# Patient Record
Sex: Male | Born: 1985 | Race: White | Hispanic: No | Marital: Married | Smoking: Never smoker
Health system: Southern US, Community
[De-identification: ages and names within clinical notes are randomized; demographics above are authoritative.]

## PROBLEM LIST (undated history)

## (undated) DIAGNOSIS — F988 Other specified behavioral and emotional disorders with onset usually occurring in childhood and adolescence: Secondary | ICD-10-CM

## (undated) DIAGNOSIS — T7840XA Allergy, unspecified, initial encounter: Secondary | ICD-10-CM

## (undated) HISTORY — DX: Other specified behavioral and emotional disorders with onset usually occurring in childhood and adolescence: F98.8

## (undated) HISTORY — PX: FRACTURE SURGERY: SHX138

## (undated) HISTORY — DX: Allergy, unspecified, initial encounter: T78.40XA

---

## 2019-04-12 ENCOUNTER — Other Ambulatory Visit: Payer: Self-pay

## 2019-04-12 ENCOUNTER — Telehealth: Payer: Self-pay

## 2019-04-12 NOTE — Telephone Encounter (Signed)
Error

## 2019-04-13 ENCOUNTER — Other Ambulatory Visit: Payer: Self-pay

## 2019-04-13 ENCOUNTER — Ambulatory Visit (INDEPENDENT_AMBULATORY_CARE_PROVIDER_SITE_OTHER): Payer: BC Managed Care – PPO | Admitting: Nurse Practitioner

## 2019-04-13 ENCOUNTER — Encounter: Payer: Self-pay | Admitting: Nurse Practitioner

## 2019-04-13 VITALS — BP 126/72 | HR 76 | Temp 98.3°F | Resp 18 | Ht 73.0 in | Wt 259.6 lb

## 2019-04-13 DIAGNOSIS — J329 Chronic sinusitis, unspecified: Secondary | ICD-10-CM

## 2019-04-13 DIAGNOSIS — S80862A Insect bite (nonvenomous), left lower leg, initial encounter: Secondary | ICD-10-CM | POA: Diagnosis not present

## 2019-04-13 DIAGNOSIS — T7840XA Allergy, unspecified, initial encounter: Secondary | ICD-10-CM | POA: Insufficient documentation

## 2019-04-13 DIAGNOSIS — Z23 Encounter for immunization: Secondary | ICD-10-CM | POA: Diagnosis not present

## 2019-04-13 DIAGNOSIS — J45909 Unspecified asthma, uncomplicated: Secondary | ICD-10-CM | POA: Diagnosis not present

## 2019-04-13 DIAGNOSIS — L239 Allergic contact dermatitis, unspecified cause: Secondary | ICD-10-CM

## 2019-04-13 DIAGNOSIS — F988 Other specified behavioral and emotional disorders with onset usually occurring in childhood and adolescence: Secondary | ICD-10-CM | POA: Diagnosis not present

## 2019-04-13 DIAGNOSIS — Z1329 Encounter for screening for other suspected endocrine disorder: Secondary | ICD-10-CM

## 2019-04-13 DIAGNOSIS — W57XXXA Bitten or stung by nonvenomous insect and other nonvenomous arthropods, initial encounter: Secondary | ICD-10-CM

## 2019-04-13 DIAGNOSIS — Z7689 Persons encountering health services in other specified circumstances: Secondary | ICD-10-CM

## 2019-04-13 DIAGNOSIS — Z130 Encounter for screening for diseases of the blood and blood-forming organs and certain disorders involving the immune mechanism: Secondary | ICD-10-CM

## 2019-04-13 DIAGNOSIS — L03116 Cellulitis of left lower limb: Secondary | ICD-10-CM

## 2019-04-13 DIAGNOSIS — Z13220 Encounter for screening for lipoid disorders: Secondary | ICD-10-CM

## 2019-04-13 DIAGNOSIS — Z13228 Encounter for screening for other metabolic disorders: Secondary | ICD-10-CM

## 2019-04-13 HISTORY — DX: Unspecified asthma, uncomplicated: J45.909

## 2019-04-13 MED ORDER — DOXYCYCLINE HYCLATE 100 MG PO TABS: 100.0000 mg | ORAL_TABLET | Freq: Two times a day (BID) | ORAL | 0 refills | Status: DC

## 2019-04-13 NOTE — Progress Notes (Signed)
New Patient Office Visit  Subjective:  Patient ID: Alan Wong, male    DOB: [birth year]  Age: 34 y.o. MRN: [id number]  CC:  Chief Complaint  Patient presents with  . Establish Care    HPI Alan Wong is a 34 y.o. caucasian male presenting to clinic to establish care. Time spent discussing past medical hx of allergies with allergist in [place] did not feel helped does not desire referral in region, Asthma controlled with current regimen, stopped taking Singulair did not feel worked,  Desires ENT referral for opinion on chronic nasal and sinus pressure without exacerbations, has a fill for ADD medications not needing today will plan on monthly follow up for ADD, upt on vaccine except influenza and desires today, reports insect bite to left lower calf medial side initially was small red raised dot that started on Tuesday then Wed he noticed red rash that is pruritic spread. He has used zyrtec by mouth and steroid cream at time or two that did decrease minimally. Denied CP/CT, GU/GI sxs, pain, sob, palpitations, edema, or falls.  Past Medical History:  Diagnosis Date  . ADD (attention deficit disorder)   . Allergy   . Asthma   . Asthma 04/13/2019    History reviewed. No pertinent surgical history.  History reviewed. No pertinent family history.  Social History   Socioeconomic History  . Marital status: Married    Spouse name: Not on file  . Number of children: Not on file  . Years of education: Not on file  . Highest education level: Not on file  Occupational History  . Not on file  Tobacco Use  . Smoking status: Never Smoker  . Smokeless tobacco: Never Used  Substance and Sexual Activity  . Alcohol use: Yes    Comment: 3-4 per week  . Drug use: Never  . Sexual activity: Not on file  Other Topics Concern  . Not on file  Social History Narrative  . Not on file   Social Determinants of Health   Financial Resource Strain:   . Difficulty of Paying Living Expenses:  Not on file  Food Insecurity:   . Worried About Charity fundraiser in the Last Year: Not on file  . Ran Out of Food in the Last Year: Not on file  Transportation Needs:   . Lack of Transportation (Medical): Not on file  . Lack of Transportation (Non-Medical): Not on file  Physical Activity:   . Days of Exercise per Week: Not on file  . Minutes of Exercise per Session: Not on file  Stress:   . Feeling of Stress : Not on file  Social Connections:   . Frequency of Communication with Friends and Family: Not on file  . Frequency of Social Gatherings with Friends and Family: Not on file  . Attends Religious Services: Not on file  . Active Member of Clubs or Organizations: Not on file  . Attends Archivist Meetings: Not on file  . Marital Status: Not on file  Intimate Partner Violence:   . Fear of Current or Ex-Partner: Not on file  . Emotionally Abused: Not on file  . Physically Abused: Not on file  . Sexually Abused: Not on file    ROS Review of Systems  All other systems reviewed and are negative.   Objective:   Today's Vitals: BP 126/72 (BP Location: Right Arm, Patient Position: Sitting, Cuff Size: Large)   Pulse 76   Temp 98.3 F (36.8 C) (Oral)  Resp 18   Ht 6\' 1"  (1.854 m)   Wt 259 lb 9.6 oz (117.8 kg)   SpO2 96%   BMI 34.25 kg/m   Physical Exam Vitals and nursing note reviewed.  Constitutional:      Appearance: Normal appearance.  HENT:     Head: Normocephalic.     Right Ear: Tympanic membrane, ear canal and external ear normal.     Left Ear: Tympanic membrane, ear canal and external ear normal.     Nose: Nose normal.     Mouth/Throat:     Mouth: Mucous membranes are moist.     Pharynx: Oropharynx is clear.  Eyes:     Extraocular Movements: Extraocular movements intact.     Conjunctiva/sclera: Conjunctivae normal.     Pupils: Pupils are equal, round, and reactive to light.  Cardiovascular:     Rate and Rhythm: Normal rate and regular rhythm.       Pulses: Normal pulses.     Heart sounds: Normal heart sounds.  Pulmonary:     Effort: Pulmonary effort is normal.     Breath sounds: Normal breath sounds.  Abdominal:     General: Abdomen is flat. Bowel sounds are normal.     Palpations: Abdomen is soft.  Musculoskeletal:        General: Normal range of motion.     Cervical back: Normal range of motion and neck supple.  Lymphadenopathy:     Head:     Right side of head: No submental, submandibular, tonsillar, preauricular, posterior auricular or occipital adenopathy.     Left side of head: No submental, submandibular, tonsillar, preauricular, posterior auricular or occipital adenopathy.     Cervical: No cervical adenopathy.  Skin:    General: Skin is warm and dry.     Findings: Pustular rash: rash RLE.           Comments: See skin RLE exam  Neurological:     General: No focal deficit present.     Mental Status: He is alert and oriented to person, place, and time.  Psychiatric:        Mood and Affect: Mood normal.        Behavior: Behavior normal.        Thought Content: Thought content normal.        Judgment: Judgment normal.     Assessment & Plan:  You are not due for refill for ADD controlling medications, Follow up monthly for ADD Follow up end of 01/2020 or first of 01/2020 for annual exam with labs one week prior to appointment. As discussed, ENT referral for chronic sinus pressure will be completed today.   Start and complete antibiotic prescribed today, continue using by mouth zyrtec daily, cortisone cream twice a day and Pepcid 20mg  over the counter twice a day (12 hours apart)   Follow up for worsening or non resolving right lower leg rash.  Asthma stable with current medication regimen; has not been taking Singulair.   You have established care today.   Problem List Items Addressed This Visit      Respiratory   Asthma     Other   ADD (attention deficit disorder)    Other Visit Diagnoses    Insect  bite of left lower leg, initial encounter    -  Primary   Relevant Medications   doxycycline (VIBRA-TABS) 100 MG tablet   Allergic dermatitis       Cellulitis of left lower extremity  Relevant Medications   doxycycline (VIBRA-TABS) 100 MG tablet   Chronic congestion of paranasal sinus       Relevant Orders   Ambulatory referral to ENT   Screening for metabolic disorder       Relevant Orders   COMPLETE METABOLIC PANEL WITH GFR   Thyroid disorder screen       Relevant Orders   TSH   Screening for deficiency anemia       Relevant Orders   CBC with Differential/Platelet   Screening, lipid       Relevant Orders   Lipid panel      Outpatient Encounter Medications as of 04/13/2019  Medication Sig  . ADDERALL XR 30 MG 24 hr capsule Take 30 mg by mouth every morning.  Marland Kitchen ADVAIR DISKUS 250-50 MCG/DOSE AEPB SMARTSIG:1 Puff(s) By Mouth Every 12 Hours  . albuterol (VENTOLIN HFA) 108 (90 Base) MCG/ACT inhaler SMARTSIG:2 Puff(s) By Mouth Every 4 Hours PRN  . doxycycline (VIBRA-TABS) 100 MG tablet Take 1 tablet (100 mg total) by mouth 2 (two) times daily.   No facility-administered encounter medications on file as of 04/13/2019.    Follow-up: Return in about 1 month (around 05/11/2019), or ADD follow up monthly; and AWE for 01/31/2020 complete labs one week prior to apt.; as needed.Elmore Guise, FNP

## 2019-04-13 NOTE — Patient Instructions (Addendum)
Follow up monthly for ADD Follow up end of 01/2020 or first of 01/2020 for annual exam with labs one week prior to appointment. As discussed, ENT referral for chronic sinus pressure will be completed today.   Start and complete antibiotic prescribed today, continue using by mouth zyrtec daily, cortisone cream twice a day and Pepcid 20mg  over the counter twice a day (12 hours apart)  Follow up for worsening or non resolving right lower leg rash.

## 2019-08-03 ENCOUNTER — Encounter: Payer: Self-pay | Admitting: Emergency Medicine

## 2019-08-03 ENCOUNTER — Ambulatory Visit
Admission: EM | Admit: 2019-08-03 | Discharge: 2019-08-03 | Disposition: A | Discharge status: Home / Self Care | Payer: BC Managed Care – PPO | Attending: Emergency Medicine | Admitting: Emergency Medicine

## 2019-08-03 ENCOUNTER — Ambulatory Visit
Admission: RE | Admit: 2019-08-03 | Discharge: 2019-08-03 | Disposition: A | Discharge status: Home / Self Care | Payer: BC Managed Care – PPO | Source: Ambulatory Visit | Attending: Nurse Practitioner | Admitting: Nurse Practitioner

## 2019-08-03 ENCOUNTER — Other Ambulatory Visit: Payer: Self-pay

## 2019-08-03 DIAGNOSIS — N50811 Right testicular pain: Secondary | ICD-10-CM | POA: Insufficient documentation

## 2019-08-03 LAB — POCT URINALYSIS DIP (MANUAL ENTRY)
Bilirubin, UA: NEGATIVE
Blood, UA: NEGATIVE
Glucose, UA: NEGATIVE mg/dL
Ketones, POC UA: NEGATIVE mg/dL
Leukocytes, UA: NEGATIVE
Nitrite, UA: NEGATIVE
Protein Ur, POC: NEGATIVE mg/dL
Spec Grav, UA: 1.02 (ref 1.010–1.025)
Urobilinogen, UA: 0.2 E.U./dL
pH, UA: 6 (ref 5.0–8.0)

## 2019-08-03 MED ORDER — DOXYCYCLINE HYCLATE 100 MG PO CAPS
100.0000 mg | ORAL_CAPSULE | Freq: Two times a day (BID) | ORAL | 0 refills | Status: DC
Start: 2019-08-03 — End: 2021-11-18

## 2019-08-03 MED ORDER — CEFTRIAXONE SODIUM 1 G IJ SOLR
1.0000 g | Freq: Once | INTRAMUSCULAR | Status: AC
  Administered 2019-08-03: 1 g via INTRAMUSCULAR

## 2019-08-03 NOTE — ED Triage Notes (Signed)
Pt c/o multiple tick bites on his right upper thigh. Occurred a little more than a month ago. He denies rash. Pt also c/o testicular pain. He states it started out as just the right testicle but now both. Started about a month ago. Denies fever, chills or body aches. Pt states he noticed the pain more today which prompted him to be seen.

## 2019-08-03 NOTE — ED Provider Notes (Signed)
[facility]    CSN: [id number] Arrival date & time: 08/03/19  1119      History   Chief Complaint Chief Complaint  Patient presents with  . Insect Bite    tick  . Testicle Pain    HPI Alan Wong is a 34 y.o. male.   Patient presents with intermittent right testicular pain x1 month; worse x1 day.  He states his epididymis is tender to palpation.  He also reports he had multiple tick bites on his right thigh 1.5 months ago.  He denies fever, chills, rash, abdominal pain, dysuria, penile discharge, back pain, or other symptoms.  No treatment attempted at home.  The history is provided by the patient.    Past Medical History:  Diagnosis Date  . ADD (attention deficit disorder)   . Allergy   . Asthma   . Asthma 04/13/2019    Patient Active Problem List   Diagnosis Date Noted  . Asthma 04/13/2019  . ADD (attention deficit disorder)   . Allergy     Past Surgical History:  Procedure Laterality Date  . FRACTURE SURGERY Left    arm       Home Medications    Prior to Admission medications   Medication Sig Start Date End Date Taking? Authorizing Provider  ADDERALL XR 30 MG 24 hr capsule Take 30 mg by mouth every morning. 03/13/19  Yes [provider]  albuterol (VENTOLIN HFA) 108 (90 Base) MCG/ACT inhaler SMARTSIG:2 Puff(s) By Mouth Every 4 Hours PRN 02/10/19  Yes [provider]  ADVAIR DISKUS 250-50 MCG/DOSE AEPB SMARTSIG:1 Puff(s) By Mouth Every 12 Hours 01/04/19   [provider]  doxycycline (VIBRAMYCIN) 100 MG capsule Take 1 capsule (100 mg total) by mouth 2 (two) times daily. 08/03/19   Sharion Balloon, NP    Family History Family History  Problem Relation Age of Onset  . Thyroid disease Mother   . Hypertension Father     Social History Social History   Tobacco Use  . Smoking status: Never Smoker  . Smokeless tobacco: Never Used  Substance Use Topics  . Alcohol use: Yes    Comment: 3-4 per week  . Drug use: Never      Allergies   Erythromycin   Review of Systems Review of Systems  Constitutional: Negative for chills and fever.  HENT: Negative for ear pain and sore throat.   Eyes: Negative for pain and visual disturbance.  Respiratory: Negative for cough and shortness of breath.   Cardiovascular: Negative for chest pain and palpitations.  Gastrointestinal: Negative for abdominal pain and vomiting.  Genitourinary: Positive for testicular pain. Negative for discharge, dysuria, flank pain and hematuria.  Musculoskeletal: Negative for arthralgias and back pain.  Skin: Negative for color change and rash.  Neurological: Negative for seizures and syncope.  All other systems reviewed and are negative.    Physical Exam Triage Vital Signs ED Triage Vitals  Enc Vitals Group     BP      Pulse      Resp      Temp      Temp src      SpO2      Weight      Height      Head Circumference      Peak Flow      Pain Score      Pain Loc      Pain Edu?      Excl. in [place]?  No data found.  Updated Vital Signs BP 110/76 (BP Location: Left Arm)   Pulse 71   Temp 98.5 F (36.9 C) (Oral)   Resp 18   Ht 6\' 1"  (1.854 m)   Wt 240 lb (108.9 kg)   SpO2 97%   BMI 31.66 kg/m   Visual Acuity Right Eye Distance:   Left Eye Distance:   Bilateral Distance:    Right Eye Near:   Left Eye Near:    Bilateral Near:     Physical Exam Vitals and nursing note reviewed.  Constitutional:      General: He is not in acute distress.    Appearance: He is well-developed. He is not ill-appearing.  HENT:     Head: Normocephalic and atraumatic.     Mouth/Throat:     Mouth: Mucous membranes are moist.     Pharynx: Oropharynx is clear.  Eyes:     Conjunctiva/sclera: Conjunctivae normal.  Cardiovascular:     Rate and Rhythm: Normal rate and regular rhythm.     Heart sounds: No murmur.  Pulmonary:     Effort: Pulmonary effort is normal. No respiratory distress.     Breath sounds: Normal breath sounds.   Abdominal:     Palpations: Abdomen is soft.     Tenderness: There is no abdominal tenderness. There is no right CVA tenderness, left CVA tenderness, guarding or rebound.  Genitourinary:    Penis: Normal.      Comments: Right testicle mildly tender to palpation.   Musculoskeletal:     Cervical back: Neck supple.  Skin:    General: Skin is warm and dry.     Findings: No lesion or rash.  Neurological:     General: No focal deficit present.     Mental Status: He is alert and oriented to person, place, and time.     Gait: Gait normal.  Psychiatric:        Mood and Affect: Mood normal.        Behavior: Behavior normal.      UC Treatments / Results  Labs (all labs ordered are listed, but only abnormal results are displayed) Labs Reviewed  URINE CULTURE  POCT URINALYSIS DIP (MANUAL ENTRY)  CYTOLOGY, (ORAL, ANAL, URETHRAL) ANCILLARY ONLY    EKG   Radiology SCROTUM W/DOPPLER  Result Date: 08/03/2019 CLINICAL DATA:  Right testicular pain. EXAM: SCROTAL ULTRASOUND DOPPLER ULTRASOUND OF THE TESTICLES TECHNIQUE: Complete ultrasound examination of the testicles, epididymis, and other scrotal structures was performed. Color and spectral Doppler ultrasound were also utilized to evaluate blood flow to the testicles. COMPARISON:  No prior. FINDINGS: Right testicle Measurements: 4.5 x 2.3 x 2.9 cm. No mass or microlithiasis visualized. Left testicle Measurements: 4.1 x 2.0 x 2.8 cm. No mass or microlithiasis visualized. Right epididymis:  Normal in size and appearance. Left epididymis:  Normal in size and appearance. Hydrocele:  None visualized. Varicocele:  None visualized. Pulsed Doppler interrogation of both testes demonstrates normal low resistance arterial and venous waveforms bilaterally. IMPRESSION: Negative exam. Electronically Signed   By: 10/03/2019  Register   On: 08/03/2019 13:35    Procedures Procedures (including critical care time)  Medications Ordered in UC Medications   cefTRIAXone (ROCEPHIN) injection 1 g (1 g Intramuscular Given 08/03/19 1243)    Initial Impression / Assessment and Plan / UC Course  I have reviewed the triage vital signs and the nursing notes.  Pertinent labs & imaging results that were available during my care of the patient were  reviewed by me and considered in my medical decision making (see chart for details).   Right testicular pain.  Ultrasound negative.  Urine normal.  Urethral swab obtained for STD testing.  Treated with Rocephin injection here and prescription for doxycycline x10 days.  Instructed patient to follow-up with his PCP if his symptoms are not improving.  Patient agrees to plan of care.     Final Clinical Impressions(s) / UC Diagnoses   Final diagnoses:  Right testicular pain     Discharge Instructions     Go directly to [facility] for the ultrasound.  Wait there until I call you with the results.    Your urine culture is pending.  Your penile swab results are pending.    You were given an injection of Rocephin today.  Also take the doxycycline twice a day for 10 days.    Follow up with your primary care provider if your symptoms are not improving.        ED Prescriptions    Medication Sig Dispense Auth. Provider   doxycycline (VIBRAMYCIN) 100 MG capsule Take 1 capsule (100 mg total) by mouth 2 (two) times daily. 20 capsule Mickie Bail, NP     PDMP not reviewed this encounter.   Mickie Bail, NP 08/03/19 1342

## 2019-08-03 NOTE — Discharge Instructions (Addendum)
Go directly to [facility] for the ultrasound.  Wait there until I call you with the results.    Your urine culture is pending.  Your penile swab results are pending.    You were given an injection of Rocephin today.  Also take the doxycycline twice a day for 10 days.    Follow up with your primary care provider if your symptoms are not improving.

## 2019-08-04 LAB — URINE CULTURE: Culture: NO GROWTH

## 2019-08-07 LAB — CYTOLOGY, (ORAL, ANAL, URETHRAL) ANCILLARY ONLY
Chlamydia: NEGATIVE
Comment: NEGATIVE
Comment: NEGATIVE
Comment: NORMAL
Neisseria Gonorrhea: NEGATIVE
Trichomonas: NEGATIVE

## 2021-10-11 ENCOUNTER — Ambulatory Visit (HOSPITAL_COMMUNITY)
Admission: RE | Admit: 2021-10-11 | Discharge: 2021-10-11 | Disposition: A | Discharge status: Home / Self Care | Payer: BC Managed Care – PPO | Source: Ambulatory Visit | Attending: Physician Assistant | Admitting: Physician Assistant

## 2021-10-11 ENCOUNTER — Other Ambulatory Visit: Payer: Self-pay

## 2021-10-11 ENCOUNTER — Encounter (HOSPITAL_COMMUNITY): Payer: Self-pay

## 2021-10-11 VITALS — BP 122/81 | HR 70 | Temp 98.3°F | Resp 18

## 2021-10-11 DIAGNOSIS — R300 Dysuria: Secondary | ICD-10-CM

## 2021-10-11 DIAGNOSIS — R6883 Chills (without fever): Secondary | ICD-10-CM | POA: Diagnosis not present

## 2021-10-11 DIAGNOSIS — W57XXXA Bitten or stung by nonvenomous insect and other nonvenomous arthropods, initial encounter: Secondary | ICD-10-CM

## 2021-10-11 DIAGNOSIS — S30863A Insect bite (nonvenomous) of scrotum and testes, initial encounter: Secondary | ICD-10-CM | POA: Diagnosis not present

## 2021-10-11 DIAGNOSIS — R0789 Other chest pain: Secondary | ICD-10-CM | POA: Diagnosis not present

## 2021-10-11 LAB — POCT URINALYSIS DIPSTICK, ED / UC
Bilirubin Urine: NEGATIVE
Glucose, UA: NEGATIVE mg/dL
Hgb urine dipstick: NEGATIVE
Ketones, ur: NEGATIVE mg/dL
Leukocytes,Ua: NEGATIVE
Nitrite: NEGATIVE
Protein, ur: NEGATIVE mg/dL
Specific Gravity, Urine: 1.02 (ref 1.005–1.030)
Urobilinogen, UA: 0.2 mg/dL (ref 0.0–1.0)
pH: 5.5 (ref 5.0–8.0)

## 2021-10-11 NOTE — ED Triage Notes (Signed)
Flu-like symptoms for 2 days: chills, back and legs were achy.  Soreness in torso with movement of arms.  Yesterday felt sharp chest pain, itntermittent   3 weeks ago reports a tick bite, located on scrotum.  Noticed increased urination.  Patient had left over doxycycline for a spider bite "from years ago".  Patient reports taking 7 day course of antibiotic from 3 years ago.

## 2021-10-11 NOTE — ED Provider Notes (Signed)
[facility]   MRN: [id number] DOB: [birth year]  Subjective:   Alan Wong is a 36 y.o. male presenting for a multitude of symptoms today.  Patient reports that approximately 2 days ago he started to have chills and body aches.  He did not check his temperature to see if he actually had a fever.  He denies any headache or dizziness.  He said that he was working a lot outside in the heat yesterday.  He was having some sharp chest pain across his upper chest.  Today he noticed chest pain with deep breathing only this morning.  No chest pain or shortness of breath at this time.  No cardiac history.  He does have a history of intermittent asthma.  The only sick contact he reports is his daughter who had cough and congestion about 5 days ago.  Patient also reports that 3 weeks ago he noticed a tick on his scrotum.  It was not engorged.  It was a brown tick.  He was able to remove it.  Within a few days he started to notice some increased urinary symptoms and some dysuria.  He reported that he had leftover doxycycline from a spider bite from several years ago.  He took a 7-day course of doxycycline.  He denies any redness around the site.  He did not see any bull's-eye develop.  He does state that overall he is feeling better today.  He did not check a COVID test at home.  No recent travel.  No current facility-administered medications for this encounter.  Current Outpatient Medications:    ADDERALL XR 30 MG 24 hr capsule, Take 30 mg by mouth every morning., Disp: , Rfl:    ADVAIR DISKUS 250-50 MCG/DOSE AEPB, SMARTSIG:1 Puff(s) By Mouth Every 12 Hours (Patient not taking: Reported on 10/11/2021), Disp: , Rfl:    albuterol (VENTOLIN HFA) 108 (90 Base) MCG/ACT inhaler, SMARTSIG:2 Puff(s) By Mouth Every 4 Hours PRN, Disp: , Rfl:    doxycycline (VIBRAMYCIN) 100 MG capsule, Take 1 capsule (100 mg total) by mouth 2 (two) times daily. (Patient not taking: Reported on 10/11/2021), Disp: 20  capsule, Rfl: 0   Allergies  Allergen Reactions   Erythromycin     Past Medical History:  Diagnosis Date   ADD (attention deficit disorder)    Allergy    Asthma    Asthma 04/13/2019     Past Surgical History:  Procedure Laterality Date   FRACTURE SURGERY Left    arm    Family History  Problem Relation Age of Onset   Thyroid disease Mother    Hypertension Father     Social History   Tobacco Use   Smoking status: Never   Smokeless tobacco: Never  Vaping Use   Vaping Use: Never used  Substance Use Topics   Alcohol use: Yes    Comment: 3-4 per week   Drug use: Never    ROS REFER TO HPI FOR PERTINENT POSITIVES AND NEGATIVES   Objective:   Vitals: BP 122/81 (BP Location: Right Arm)   Pulse 70   Temp 98.3 F (36.8 C) (Oral)   Resp 18   SpO2 98%   Physical Exam Vitals and nursing note reviewed.  Constitutional:      General: He is not in acute distress.    Appearance: He is well-developed.  HENT:     Head: Normocephalic and atraumatic.     Right Ear: Tympanic membrane, ear canal and external ear normal.  Left Ear: Tympanic membrane, ear canal and external ear normal.     Nose: Nose normal. No congestion.     Mouth/Throat:     Mouth: Mucous membranes are moist.     Pharynx: Oropharynx is clear. No oropharyngeal exudate.  Eyes:     Extraocular Movements: Extraocular movements intact.     Conjunctiva/sclera: Conjunctivae normal.     Pupils: Pupils are equal, round, and reactive to light.  Cardiovascular:     Rate and Rhythm: Normal rate and regular rhythm.     Heart sounds: No murmur heard. Pulmonary:     Effort: Pulmonary effort is normal. No respiratory distress.     Breath sounds: Normal breath sounds.  Abdominal:     Palpations: Abdomen is soft.     Tenderness: There is no abdominal tenderness. There is no right CVA tenderness or left CVA tenderness.  Genitourinary:    Comments: Declined GU exam and reports that everything is  normal. Musculoskeletal:        General: No swelling.     Cervical back: Neck supple.     Right lower leg: No edema.     Left lower leg: No edema.  Skin:    General: Skin is warm and dry.     Capillary Refill: Capillary refill takes less than 2 seconds.     Findings: No rash.  Neurological:     General: No focal deficit present.     Mental Status: He is alert. Mental status is at baseline.     Cranial Nerves: No cranial nerve deficit.     Motor: No weakness.     Gait: Gait normal.  Psychiatric:        Mood and Affect: Mood normal.     Results for orders placed or performed during the hospital encounter of 10/11/21 (from the past 24 hour(s))  POC Urinalysis dipstick     Status: None   Collection Time: 10/11/21  1:07 PM  Result Value Ref Range   Glucose, UA NEGATIVE NEGATIVE mg/dL   Bilirubin Urine NEGATIVE NEGATIVE   Ketones, ur NEGATIVE NEGATIVE mg/dL   Specific Gravity, Urine 1.020 1.005 - 1.030   Hgb urine dipstick NEGATIVE NEGATIVE   pH 5.5 5.0 - 8.0   Protein, ur NEGATIVE NEGATIVE mg/dL   Urobilinogen, UA 0.2 0.0 - 1.0 mg/dL   Nitrite NEGATIVE NEGATIVE   Leukocytes,Ua NEGATIVE NEGATIVE    Assessment and Plan :   PDMP not reviewed this encounter.  1. Chills   2. Dysuria   3. Tick bite of scrotum, initial encounter   4. Other chest pain    36 year old Caucasian well-appearing male, nontoxic in the urgent care today.  Vital signs are normal.  Conglomeration of symptoms. I obtained a urinalysis, which was negative for infection or any other acute findings.  I also obtained an EKG while he was in the urgent care today.  I personally reviewed the EKG which showed a slight sinus bradycardia at 58 bpm, no ST or T wave changes.  No signs of ischemia.  Suggested that patient might want to check for COVID-19, which he will do a home rapid test and let us know results.  Most likely viral syndrome that is improving.  As he already took a course of doxycycline, I do not feel it  necessary to repeat coverage for the tick bite.  Also discussed with him that this may have skewed his urine results.  If he continues to have any urinary symptoms, he might  want to come back and repeat a urinalysis and culture at a later date next week.  Patient is going to rest and push fluids this weekend.  He will recheck if no improvement or worsening of any symptoms.  Understanding and agreeable of when to go to the emergency department.    Westlynn Fifer, Crist Infante, PA-C 10/11/21 1340

## 2021-10-11 NOTE — Discharge Instructions (Signed)
Overall reassuring exam today.  It is possible that you may have had a viral syndrome such as COVID-19.  Your urinalysis was negative for infection.  Your EKG was normal.  I recommend pushing fluids and rest this weekend.  You may take Tylenol or ibuprofen as you need.  As you already completed a course of doxycycline, I would not repeat any antibiotics at this time.  You may follow-up with primary care or back to the urgent care if any change in symptoms or no improvement.  Please follow-up with the emergency department if acutely worse symptoms arise.

## 2021-10-31 ENCOUNTER — Ambulatory Visit
Admission: RE | Admit: 2021-10-31 | Discharge: 2021-10-31 | Disposition: A | Discharge status: Home / Self Care | Payer: BC Managed Care – PPO | Source: Ambulatory Visit | Attending: Family Medicine | Admitting: Family Medicine

## 2021-10-31 VITALS — BP 144/90 | HR 76 | Temp 97.7°F | Resp 16

## 2021-10-31 DIAGNOSIS — N5082 Scrotal pain: Secondary | ICD-10-CM

## 2021-10-31 DIAGNOSIS — N368 Other specified disorders of urethra: Secondary | ICD-10-CM | POA: Diagnosis not present

## 2021-10-31 DIAGNOSIS — R399 Unspecified symptoms and signs involving the genitourinary system: Secondary | ICD-10-CM

## 2021-10-31 LAB — POCT URINALYSIS DIP (MANUAL ENTRY)
Bilirubin, UA: NEGATIVE
Blood, UA: NEGATIVE
Glucose, UA: NEGATIVE mg/dL
Ketones, POC UA: NEGATIVE mg/dL
Leukocytes, UA: NEGATIVE
Nitrite, UA: NEGATIVE
Protein Ur, POC: NEGATIVE mg/dL
Spec Grav, UA: 1.02 (ref 1.010–1.025)
Urobilinogen, UA: 0.2 E.U./dL
pH, UA: 7 (ref 5.0–8.0)

## 2021-10-31 NOTE — ED Provider Notes (Signed)
Alan Wong    CSN: [id number] Arrival date & time: 10/31/21  4098      History   Chief Complaint Chief Complaint  Patient presents with   Urinary Frequency    Red swelling on inside of tip of urethra - Entered by patient    HPI Alan Wong is a 36 y.o. male.   HPI Patient presents today for evaluation of 6 weeks of ongoing dysuria and urine frequency x six weeks. Patient reports removal of a tick from his scrotum 6 weeks ago. He completed a course of left-over doxycyline for 7 days and was seen at [facility] on 10/11/2021 for evaluation of the same with an unremarkable urinalysis work up. He denies any scrotal swelling or pain at present, however reports noticing redness at the tip of his penis a few days ago which concerned him. Reports initially pain with urine making contact with the tip of his penis but not occurring at present and redness of penis is improving. Denies any recent sexual intercourse. He is not a diabetic. Urinary frequency occurs mostly during the day. Dysuria is intermittently. No abdominal pain, hematuria, or flank pain. He endorses a sensation of pressure involving the bladder region.  Past Medical History:  Diagnosis Date   ADD (attention deficit disorder)    Allergy    Asthma    Asthma 04/13/2019    Patient Active Problem List   Diagnosis Date Noted   Asthma 04/13/2019   ADD (attention deficit disorder)    Allergy     Past Surgical History:  Procedure Laterality Date   FRACTURE SURGERY Left    arm       Home Medications    Prior to Admission medications   Medication Sig Start Date End Date Taking? Authorizing Provider  ADDERALL XR 30 MG 24 hr capsule Take 30 mg by mouth every morning. 03/13/19   [provider]  ADVAIR DISKUS 250-50 MCG/DOSE AEPB SMARTSIG:1 Puff(s) By Mouth Every 12 Hours Patient not taking: Reported on 10/11/2021 01/04/19   [provider]  albuterol (VENTOLIN HFA) 108 (90 Base) MCG/ACT  inhaler SMARTSIG:2 Puff(s) By Mouth Every 4 Hours PRN 02/10/19   [provider]  doxycycline (VIBRAMYCIN) 100 MG capsule Take 1 capsule (100 mg total) by mouth 2 (two) times daily. Patient not taking: Reported on 10/11/2021 08/03/19   Mickie Bail, NP    Family History Family History  Problem Relation Age of Onset   Thyroid disease Mother    Hypertension Father     Social History Social History   Tobacco Use   Smoking status: Never   Smokeless tobacco: Never  Vaping Use   Vaping Use: Never used  Substance Use Topics   Alcohol use: Yes    Comment: 3-4 per week   Drug use: Never     Allergies   Erythromycin   Review of Systems Review of Systems Pertinent negatives listed in HPI   Physical Exam Triage Vital Signs ED Triage Vitals  Enc Vitals Group     BP 10/31/21 1002 (!) 144/90     Pulse Rate 10/31/21 1002 76     Resp 10/31/21 1002 16     Temp 10/31/21 1002 97.7 F (36.5 C)     Temp Source 10/31/21 1002 Temporal     SpO2 10/31/21 1002 98 %     Weight --      Height --      Head Circumference --  Peak Flow --      Pain Score 10/31/21 1032 0     Pain Loc --      Pain Edu? --      Excl. in GC? --    No data found.  Updated Vital Signs BP (!) 144/90 (BP Location: Left Arm)   Pulse 76   Temp 97.7 F (36.5 C) (Temporal)   Resp 16   SpO2 98%   Visual Acuity Right Eye Distance:   Left Eye Distance:   Bilateral Distance:    Right Eye Near:   Left Eye Near:    Bilateral Near:     Physical Exam Vitals and nursing note reviewed. Exam conducted with a chaperone present.  Constitutional:      Appearance: Normal appearance.  HENT:     Head: Normocephalic and atraumatic.  Cardiovascular:     Rate and Rhythm: Normal rate and regular rhythm.  Genitourinary:    Comments: Mild <1 mm of erythema proximal urethral meatus of penis. Papule 1.5 mm non-pustular/non vesicular glans penis  Neurological:     General: No focal deficit present.      Mental Status: He is alert.      UC Treatments / Results  Labs (all labs ordered are listed, but only abnormal results are displayed) Labs Reviewed  POCT URINALYSIS DIP (MANUAL ENTRY)    EKG   Radiology No results found.  Procedures Procedures (including critical care time)  Medications Ordered in UC Medications - No data to display  Initial Impression / Assessment and Plan / UC Course  I have reviewed the triage vital signs and the nursing notes.  Pertinent labs & imaging results that were available during my care of the patient were reviewed by me and considered in my medical decision making (see chart for details).    Suspect LUTs, given unremarkable urine analysis x 2 evaluation. Physical exam over unremarkable today.  Discussed with patient indications for follow-up with urology given his symptoms.  Patient agreed to referral.  Encouraged him to drinking plenty water.  Referral placed advised that urology office will contact him directly to schedule an appointment.  Final Clinical Impressions(s) / UC Diagnoses   Final diagnoses:  Lower urinary tract symptoms (LUTS)  Scrotum pain     Discharge Instructions      I have referred you to urology given the length and duration of your symptoms. [facility] will call you directly or you can contact them to schedule an appointment.  If you choose to follow-up with your primary care provider and request a referral within the [facility], you can always decline follow-up with [facility]. The redness of urethra of penis appears to be irritation which appears to being resolving without any need for additional treatment. If you develop any severe back or abdominal pain, or began urinating blood, these symptoms indicate the need to go immediately to the Emergency department.   ED Prescriptions   None    PDMP not reviewed this encounter.   Bing Neighbors, FNP 11/04/21 2316

## 2021-10-31 NOTE — Discharge Instructions (Signed)
I have referred you to urology given the length and duration of your symptoms. [facility] will call you directly or you can contact them to schedule an appointment.  If you choose to follow-up with your primary care provider and request a referral within the [facility], you can always decline follow-up with [facility]. The redness of urethra of penis appears to be irritation which appears to being resolving without any need for additional treatment. If you develop any severe back or abdominal pain, or began urinating blood, these symptoms indicate the need to go immediately to the Emergency department.

## 2021-10-31 NOTE — ED Triage Notes (Signed)
Patient presents to UC for urinary frequency, dysuria x 6 weeks. He states he self-treated with expired doxycycline. He reports he removed a tick off his scrotum x 6 weeks ago and unsure if symptoms are related. He states he does have some tenderness to scrotum. Reports he was seen at [facility] for similar symptoms.   Denies fever, chills.

## 2021-11-17 NOTE — Progress Notes (Unsigned)
11/18/2021 3:57 PM   Alan Wong [birth year] [id number]  Referring provider: Scot Jun, FNP [address] [place],  [place] [address]  No chief complaint on file.   HPI: 36 year old male who presents today for further evaluation of lower urinary tract symptoms and genitourinary lesion.  He has been seen in urgent care x2 for urinary frequency and urgency over the past 6 weeks.  This started shortly after a tick bite at which time the tick was removed from his scrotum.  He was treated with 7 days of leftover doxycycline.  He was also noted to have a lesion near the urethral meatus and of the glans.  He had negative urinalyses x2.  He was not tested for STDs.   PMH: Past Medical History:  Diagnosis Date   ADD (attention deficit disorder)    Allergy    Asthma    Asthma 04/13/2019    Surgical History: Past Surgical History:  Procedure Laterality Date   FRACTURE SURGERY Left    arm    Home Medications:  Allergies as of 11/18/2021       Reactions   Erythromycin         Medication List        Accurate as of November 17, 2021  3:57 PM. If you have any questions, ask your nurse or doctor.          Adderall XR 30 MG 24 hr capsule Generic drug: amphetamine-dextroamphetamine Take 30 mg by mouth every morning.   Advair Diskus 250-50 MCG/ACT Aepb Generic drug: fluticasone-salmeterol SMARTSIG:1 Puff(s) By Mouth Every 12 Hours   albuterol 108 (90 Base) MCG/ACT inhaler Commonly known as: VENTOLIN HFA SMARTSIG:2 Puff(s) By Mouth Every 4 Hours PRN   doxycycline 100 MG capsule Commonly known as: VIBRAMYCIN Take 1 capsule (100 mg total) by mouth 2 (two) times daily.        Allergies:  Allergies  Allergen Reactions   Erythromycin     Family History: Family History  Problem Relation Age of Onset   Thyroid disease Mother    Hypertension Father     Social History:  reports that he has never smoked. He has never used smokeless tobacco. He reports  current alcohol use. He reports that he does not use drugs.   Physical Exam: There were no vitals taken for this visit.  Constitutional:  Alert and oriented, No acute distress. HEENT: [place] AT, moist mucus membranes.  Trachea midline, no masses. Cardiovascular: No clubbing, cyanosis, or edema. Respiratory: Normal respiratory effort, no increased work of breathing. GI: Abdomen is soft, nontender, nondistended, no abdominal masses GU: No CVA tenderness Skin: No rashes, bruises or suspicious lesions. Neurologic: Grossly intact, no focal deficits, moving all 4 extremities. Psychiatric: Normal mood and affect.  Laboratory Data: No results found for: "WBC", "HGB", "HCT", "MCV", "PLT"  No results found for: "CREATININE"  No results found for: "PSA"  No results found for: "TESTOSTERONE"  No results found for: "HGBA1C"  Urinalysis    Component Value Date/Time   LABSPEC 1.020 10/11/2021 1307   PHURINE 5.5 10/11/2021 1307   GLUCOSEU NEGATIVE 10/11/2021 [place] 10/11/2021 1307   BILIRUBINUR negative 10/31/2021 1006   KETONESUR negative 10/31/2021 [place] 10/11/2021 1307   PROTEINUR negative 10/31/2021 1006   PROTEINUR NEGATIVE 10/11/2021 1307   UROBILINOGEN 0.2 10/31/2021 1006   UROBILINOGEN 0.2 10/11/2021 1307   NITRITE Negative 10/31/2021 1006   NITRITE NEGATIVE 10/11/2021 1307   LEUKOCYTESUR Negative 10/31/2021  [place] 10/11/2021 1307    No results found for: "LABMICR", "WBCUA", "RBCUA", "LABEPIT", "MUCUS", "BACTERIA"  Pertinent Imaging: *** No results found for this or any previous visit.  No results found for this or any previous visit.  No results found for this or any previous visit.  No results found for this or any previous visit.  No results found for this or any previous visit.  No results found for this or any previous visit.  No results found for this or any previous visit.  No results found for this or  any previous visit.   Assessment & Plan:    There are no diagnoses linked to this encounter.  No follow-ups on file.  Hollice Espy, MD  [facility] [address] [place], [place] [address] [phone]

## 2021-11-18 ENCOUNTER — Ambulatory Visit: Payer: BC Managed Care – PPO | Admitting: Urology

## 2021-11-18 VITALS — BP 122/81 | HR 72 | Ht 73.0 in | Wt 281.0 lb

## 2021-11-18 DIAGNOSIS — R350 Frequency of micturition: Secondary | ICD-10-CM

## 2021-11-18 DIAGNOSIS — N411 Chronic prostatitis: Secondary | ICD-10-CM

## 2021-11-18 DIAGNOSIS — N489 Disorder of penis, unspecified: Secondary | ICD-10-CM

## 2021-11-18 LAB — URINALYSIS, COMPLETE
Bilirubin, UA: NEGATIVE
Glucose, UA: NEGATIVE
Ketones, UA: NEGATIVE
Leukocytes,UA: NEGATIVE
Nitrite, UA: NEGATIVE
Protein,UA: NEGATIVE
RBC, UA: NEGATIVE
Specific Gravity, UA: 1.02 (ref 1.005–1.030)
Urobilinogen, Ur: 0.2 mg/dL (ref 0.2–1.0)
pH, UA: 5.5 (ref 5.0–7.5)

## 2021-11-18 LAB — MICROSCOPIC EXAMINATION

## 2021-11-18 LAB — BLADDER SCAN AMB NON-IMAGING: Scan Result: 16

## 2021-11-18 MED ORDER — TAMSULOSIN HCL 0.4 MG PO CAPS: 0.4000 mg | ORAL_CAPSULE | Freq: Every day | ORAL | 0 refills | Status: AC

## 2021-12-12 ENCOUNTER — Other Ambulatory Visit: Payer: Self-pay | Admitting: Urology

## 2021-12-28 IMAGING — US US SCROTUM W/ DOPPLER COMPLETE
1 series · 14 of 25 positions shown · non-contrast
Comparison: No prior.

CLINICAL DATA: Right testicular pain.

EXAM:
SCROTAL ULTRASOUND
DOPPLER ULTRASOUND OF THE TESTICLES
TECHNIQUE: Complete ultrasound examination of the testicles, epididymis, and
other scrotal structures was performed. Color and spectral Doppler
ultrasound were also utilized to evaluate blood flow to the
testicles.

[Series 1: us scrotum w/doppler · 14 of 116 slices shown]
[im 1/116]
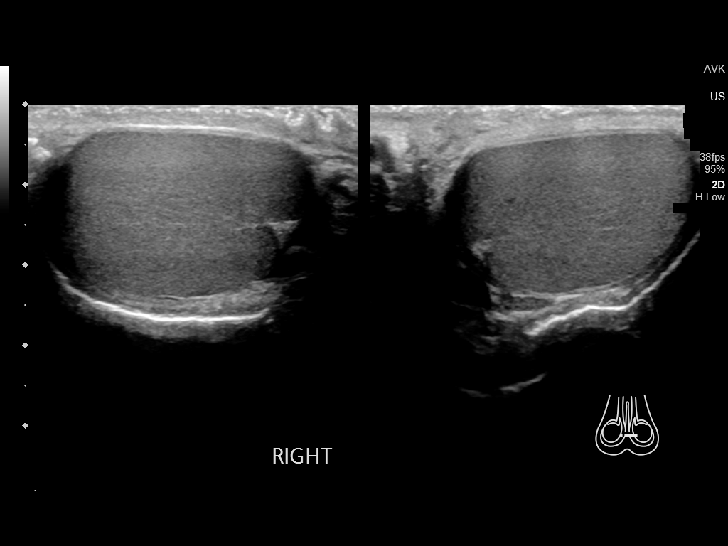
[im 10/116]
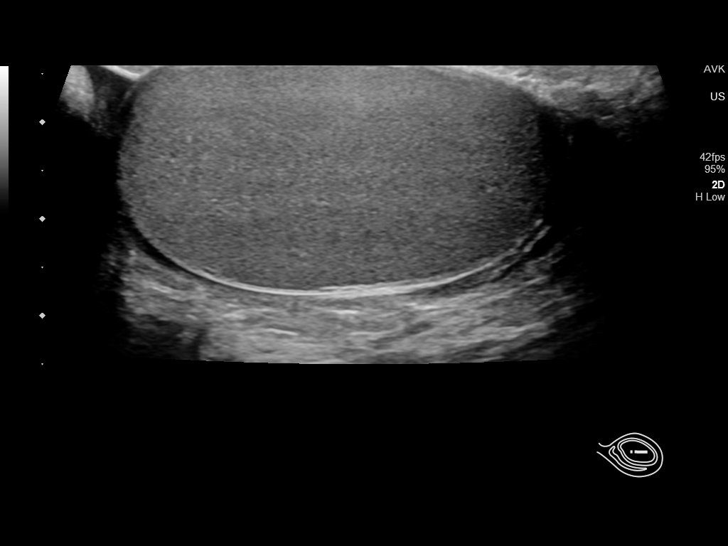
[im 20/116]
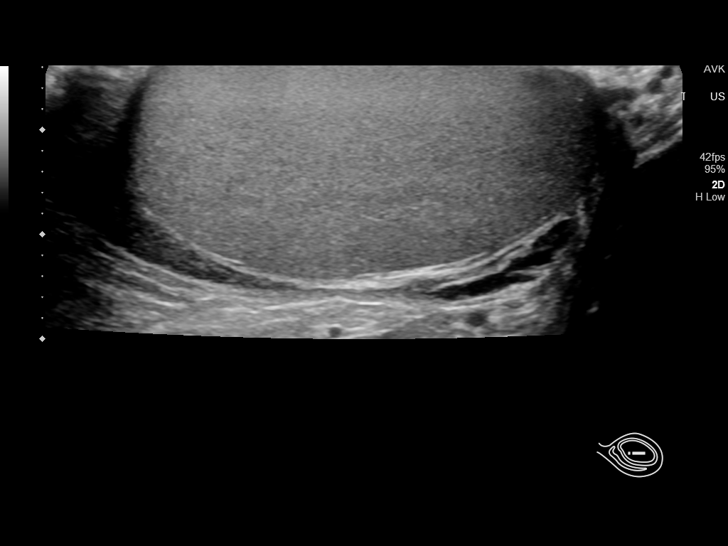
[im 29/116]
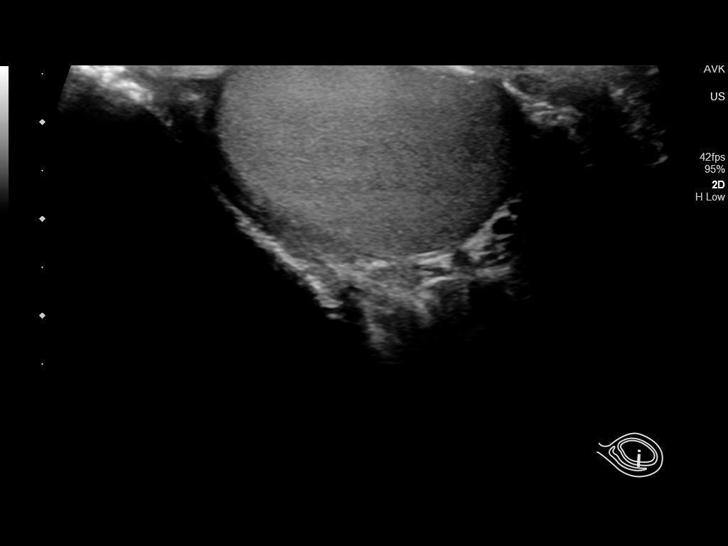
[im 39/116]
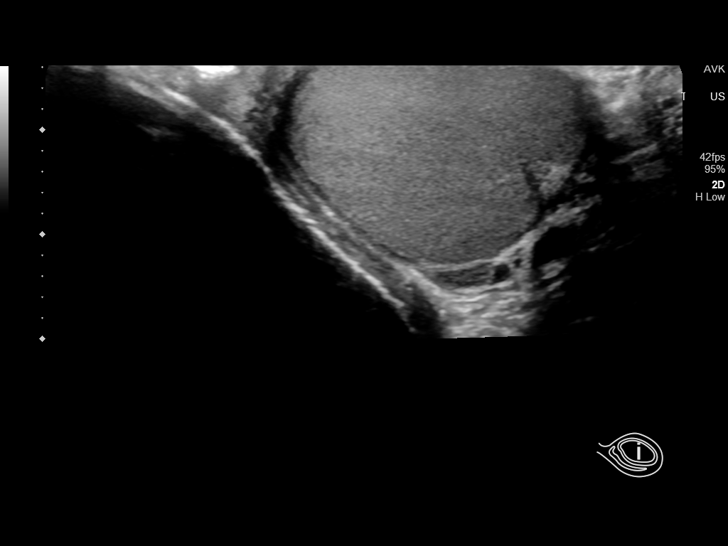
[im 44/116]
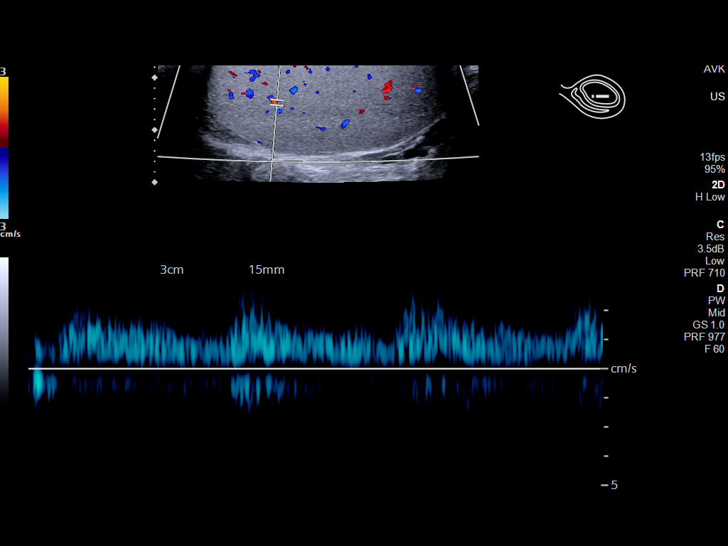
[im 53/116]
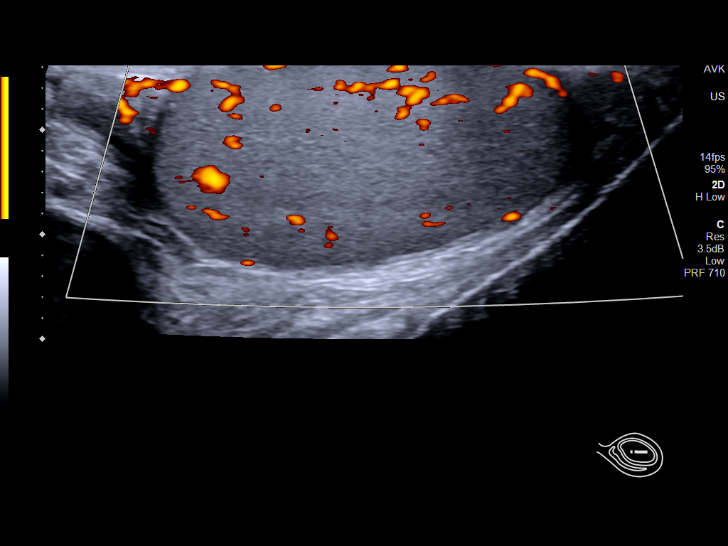
[im 63/116]
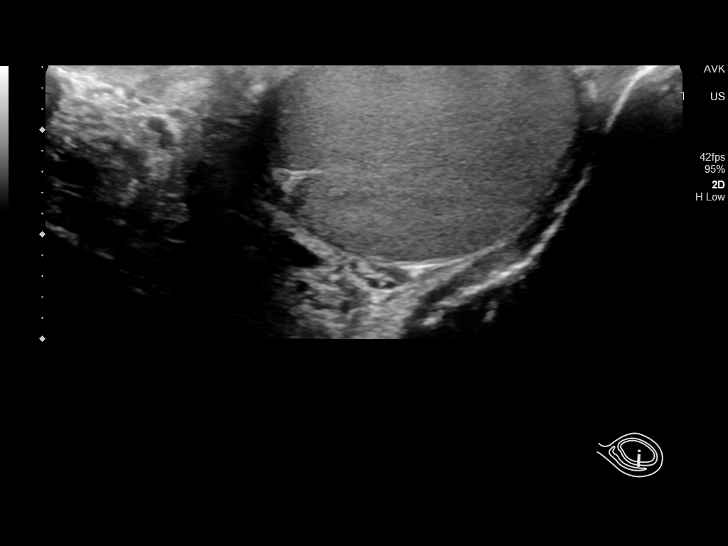
[im 72/116]
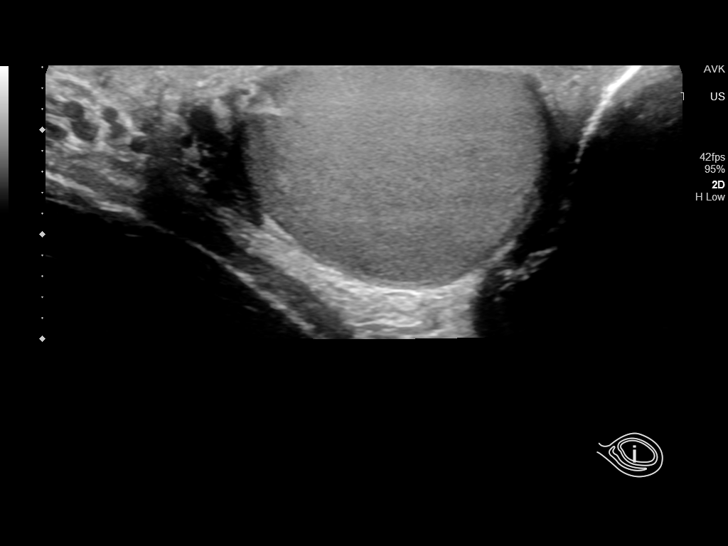
[im 77/116]
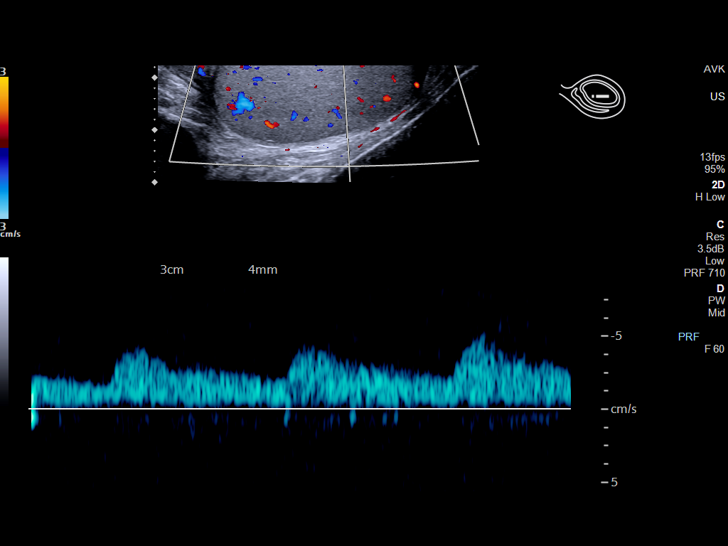
[im 87/116]
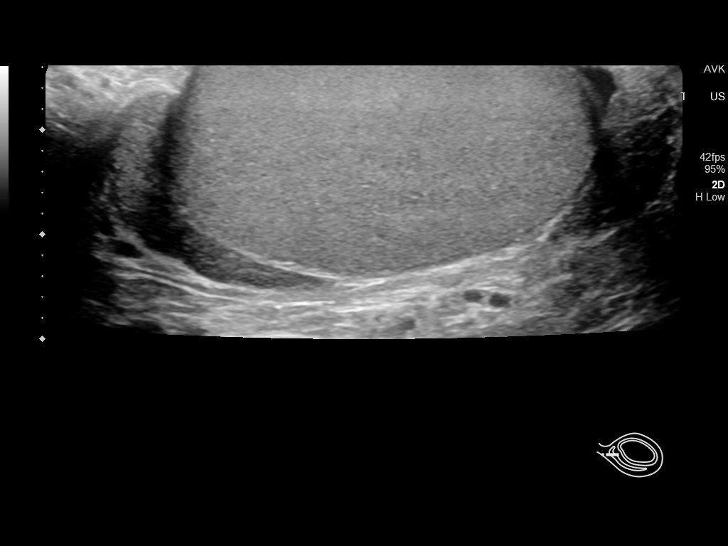
[im 96/116]
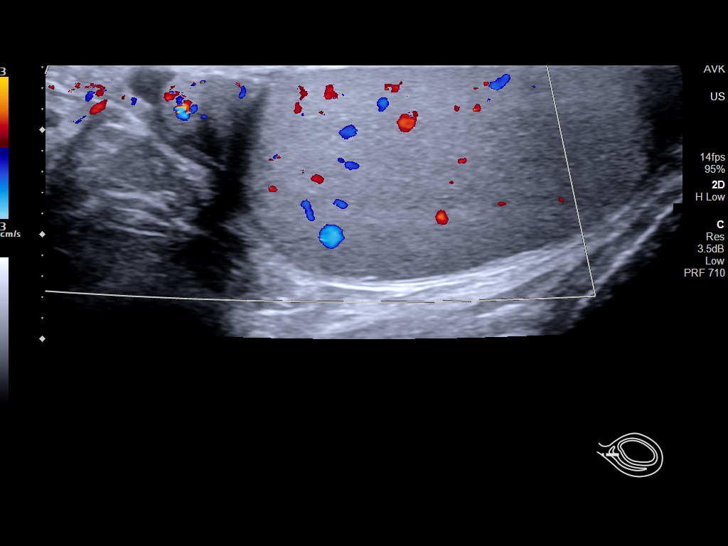
[im 106/116]
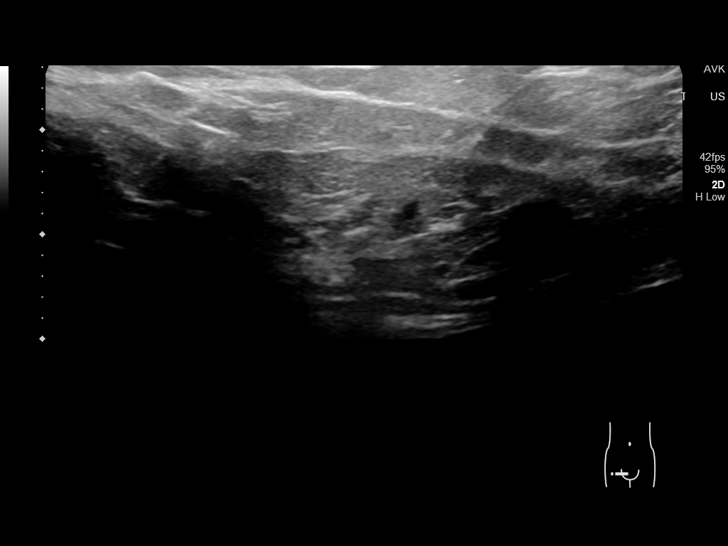
[im 116/116]
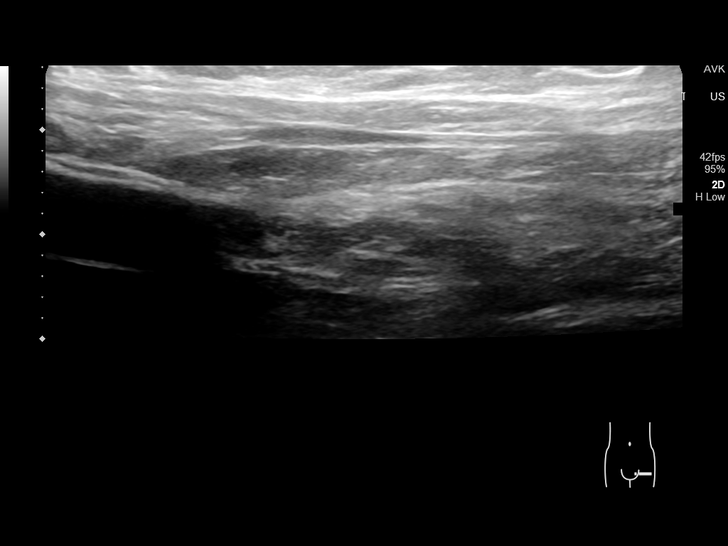

[14 of 25 positions shown; findings below may reference images not displayed]

FINDINGS: Right testicle

Measurements: 4.5 x 2.3 x 2.9 cm. No mass or microlithiasis
visualized.

Left testicle

Measurements: 4.1 x 2.0 x 2.8 cm. No mass or microlithiasis
visualized.

Right epididymis:  Normal in size and appearance.

Left epididymis:  Normal in size and appearance.

Hydrocele:  None visualized.

Varicocele:  None visualized.

Pulsed Doppler interrogation of both testes demonstrates normal low
resistance arterial and venous waveforms bilaterally.
IMPRESSION: Negative exam.
